# Patient Record
Sex: Female | Born: 1960 | Smoking: Current every day smoker
Health system: Southern US, Community
[De-identification: ages and names within clinical notes are randomized; demographics above are authoritative.]

## PROBLEM LIST (undated history)

## (undated) DIAGNOSIS — G4730 Sleep apnea, unspecified: Secondary | ICD-10-CM

## (undated) DIAGNOSIS — E785 Hyperlipidemia, unspecified: Secondary | ICD-10-CM

## (undated) HISTORY — PX: APPENDECTOMY: SHX54

## (undated) HISTORY — DX: Sleep apnea, unspecified: G47.30

## (undated) HISTORY — DX: Hyperlipidemia, unspecified: E78.5

---

## 1985-11-16 HISTORY — PX: OTHER SURGICAL HISTORY: SHX169

## 2016-08-06 ENCOUNTER — Ambulatory Visit: Payer: Self-pay | Admitting: Physical Medicine & Rehabilitation

## 2016-08-18 ENCOUNTER — Encounter: Payer: Medicaid Other | Attending: Physical Medicine & Rehabilitation

## 2016-08-18 ENCOUNTER — Encounter: Payer: Self-pay | Admitting: Physical Medicine & Rehabilitation

## 2016-08-18 ENCOUNTER — Ambulatory Visit (HOSPITAL_BASED_OUTPATIENT_CLINIC_OR_DEPARTMENT_OTHER): Payer: Medicaid Other | Admitting: Physical Medicine & Rehabilitation

## 2016-08-18 VITALS — BP 124/86 | HR 72 | Resp 16 | Ht 64.0 in | Wt 245.0 lb

## 2016-08-18 DIAGNOSIS — Z79899 Other long term (current) drug therapy: Secondary | ICD-10-CM | POA: Insufficient documentation

## 2016-08-18 DIAGNOSIS — G4730 Sleep apnea, unspecified: Secondary | ICD-10-CM | POA: Diagnosis not present

## 2016-08-18 DIAGNOSIS — G894 Chronic pain syndrome: Secondary | ICD-10-CM | POA: Insufficient documentation

## 2016-08-18 DIAGNOSIS — E785 Hyperlipidemia, unspecified: Secondary | ICD-10-CM | POA: Insufficient documentation

## 2016-08-18 DIAGNOSIS — M545 Low back pain: Secondary | ICD-10-CM | POA: Insufficient documentation

## 2016-08-18 DIAGNOSIS — Z5181 Encounter for therapeutic drug level monitoring: Secondary | ICD-10-CM

## 2016-08-18 DIAGNOSIS — M25562 Pain in left knee: Secondary | ICD-10-CM | POA: Insufficient documentation

## 2016-08-18 DIAGNOSIS — Z9851 Tubal ligation status: Secondary | ICD-10-CM | POA: Diagnosis not present

## 2016-08-18 DIAGNOSIS — M25561 Pain in right knee: Secondary | ICD-10-CM

## 2016-08-18 DIAGNOSIS — F17210 Nicotine dependence, cigarettes, uncomplicated: Secondary | ICD-10-CM | POA: Diagnosis not present

## 2016-08-18 DIAGNOSIS — G8929 Other chronic pain: Secondary | ICD-10-CM

## 2016-08-18 NOTE — Patient Instructions (Signed)
Will send you for x-rays of the low back as well as knees. We will need these done before we do anything else such as prescribed narcotics or do repeat radio frequency procedure.

## 2016-08-18 NOTE — Progress Notes (Signed)
Subjective:    Patient ID: Melinda Young, female    DOB: 07/19/1961, 55 y.o.   MRN: [id number] Chief complaint low back pain, secondary complaint left posterior thigh pain HPI Back pain has been going on for years, thigh pain, buttock pain has been going on for around 4 months. Bilateral knee pain for a number of years. No history of knee or back surgery. Patient states that her left leg goes to sleep when she holds it up. She does not recall her last x-rays. She does not recall MRI of the spine. She has had response to radiofrequency neurotomy 6 month duration. She states she did not reduce her pain medication afterwards, but she did increase her activity level. The patient states he takes 6x10 mg hydrocodone per day, 1 tablet every 4 hours as needed. She states she's tried other medications, but feels this works best for her.  Pain Inventory Average Pain 3 Pain Right Now 4 My pain is sharp  In the last 24 hours, has pain interfered with the following? General activity 6 Relation with others 3 Enjoyment of life 4 What TIME of day is your pain at its worst? night Sleep (in general) Fair  Pain is worse with: walking Pain improves with: medication Relief from Meds: 0  Mobility walk without assistance how many minutes can you walk? 10-15 ability to climb steps?  no do you drive?  no transfers alone Do you have any goals in this area?  no  Function not employed: date last employed 2007 I need assistance with the following:  household duties and shopping Do you have any goals in this area?  no  Neuro/Psych weakness  Prior Studies Any changes since last visit?  no  Physicians involved in your care Any changes since last visit?  no   History reviewed. No pertinent family history. Social History   Social History  . Marital status: Unknown    Spouse name: N/A  . Number of children: N/A  . Years of education: N/A   Social History Main Topics  . Smoking status:  Current Every Day Smoker    Packs/day: 0.50    Years: 23.00  . Smokeless tobacco: Never Used  . Alcohol use 3.6 oz/week    6 Cans of beer per week     Comment: occasionally  . Drug use: No  . Sexual activity: No   Other Topics Concern  . None   Social History Narrative  . None   Past Surgical History:  Procedure Laterality Date  . APPENDECTOMY     1972  . tubial ligation N/A 1987   Past Medical History:  Diagnosis Date  . Hyperlipidemia   . Sleep apnea    BP 124/86   Pulse 72   Resp 16   Ht 5\' 4"  (1.626 m)   Wt 245 lb (111.1 kg)   SpO2 95%   BMI 42.05 kg/m   Opioid Risk Score:   Fall Risk Score:  `1  Depression screen PHQ 2/9  [facility] 08/18/2016 08/18/2016  Decreased Interest 0 0  Down, Depressed, Hopeless 0 0  PHQ - 2 Score 0 0  Altered sleeping 1 -  Tired, decreased energy 1 -  Change in appetite 2 -  Feeling bad or failure about yourself  0 -  Trouble concentrating 2 -  Moving slowly or fidgety/restless 0 -  Suicidal thoughts 0 -  PHQ-9 Score 6 -    Review of Systems  Respiratory: Positive  for apnea.   Gastrointestinal:       Bowl problems  Neurological: Positive for weakness.  All other systems reviewed and are negative.      Objective:   Physical Exam  Constitutional: She is oriented to person, place, and time. She appears well-developed and well-nourished.  HENT:  Head: Normocephalic and atraumatic.  Eyes: Conjunctivae and EOM are normal. Pupils are equal, round, and reactive to light.  Neck: Normal range of motion.  Cardiovascular: Normal rate, regular rhythm and normal heart sounds.   No murmur heard. Pulmonary/Chest: Effort normal and breath sounds normal. No respiratory distress. She has no wheezes.  Abdominal: Soft. Bowel sounds are normal. She exhibits no distension. There is no tenderness.  Musculoskeletal:       Right hip: Normal.       Left hip: Normal.       Right knee: She exhibits no LCL laxity, normal  patellar mobility and no MCL laxity. Tenderness found. Medial joint line and lateral joint line tenderness noted. No patellar tendon tenderness noted.       Left knee: She exhibits normal range of motion, no effusion, no LCL laxity, normal patellar mobility and no MCL laxity. Tenderness found. Medial joint line and lateral joint line tenderness noted. No patellar tendon tenderness noted.       Cervical back: Normal.       Thoracic back: She exhibits decreased range of motion. She exhibits no tenderness and no deformity.       Lumbar back: She exhibits decreased range of motion and tenderness. She exhibits no deformity.  Neurological: She is alert and oriented to person, place, and time. Coordination normal.  Psychiatric: She has a normal mood and affect.  Nursing note and vitals reviewed.   Overweight female in acute distress      Assessment & Plan:  1. Chronic low back pain. Given her response to lumbar radiofrequency neurotomy, suspect facet arthropathy. We'll check x-rays, lumbar spine. Given no evidence of radiculopathy would not order MRI at the current time. We discussed that she will need some documentation of any type of abnormalities before we can assume treatment.  2. Chronic bilateral knee pain. She does have knee tenderness, but no evidence of effusion. Have ordered knee x-rays to further evaluate. If there is signs of osteoarthritis may benefit from corticosteroid injection. She did state she had these before.  We discussed that she will need urine drug screen as well as documentation of abnormalities in the painful areas described. We will look for a consistent urine drug screen with the medications that she is currently taking.  We also discussed the need for monthly visits if we do prescribe narcotic analgesics that are schedule II

## 2016-08-25 LAB — TOXASSURE SELECT,+ANTIDEPR,UR

## 2016-08-26 ENCOUNTER — Telehealth: Payer: Self-pay | Admitting: *Deleted

## 2016-08-26 NOTE — Progress Notes (Signed)
Urine drug screen for this encounter is consistent for prescribed medication 

## 2016-08-26 NOTE — Telephone Encounter (Signed)
Patient's UDS came back consistent. This was a New visit.  If narcotics are prescribed, we will need a CSA signed

## 2016-08-27 NOTE — Telephone Encounter (Signed)
noted 

## 2016-08-27 NOTE — Telephone Encounter (Signed)
Patient needs to undergo both lumbar and knee x-rays. These have been ordered. This is to document objective evidence of her complaints. We'll not prescribe any narcotic analgesics until these are done

## 2016-08-28 ENCOUNTER — Ambulatory Visit (HOSPITAL_COMMUNITY)
Admission: RE | Admit: 2016-08-28 | Discharge: 2016-08-28 | Disposition: A | Discharge status: Home / Self Care | Payer: Medicaid Other | Source: Ambulatory Visit | Attending: Physical Medicine & Rehabilitation | Admitting: Physical Medicine & Rehabilitation

## 2016-08-28 DIAGNOSIS — M5136 Other intervertebral disc degeneration, lumbar region: Secondary | ICD-10-CM | POA: Insufficient documentation

## 2016-08-28 DIAGNOSIS — R937 Abnormal findings on diagnostic imaging of other parts of musculoskeletal system: Secondary | ICD-10-CM | POA: Diagnosis not present

## 2016-08-28 DIAGNOSIS — G8929 Other chronic pain: Secondary | ICD-10-CM

## 2016-08-28 DIAGNOSIS — M545 Low back pain, unspecified: Secondary | ICD-10-CM

## 2016-08-28 DIAGNOSIS — M25562 Pain in left knee: Secondary | ICD-10-CM | POA: Insufficient documentation

## 2016-08-28 DIAGNOSIS — M4186 Other forms of scoliosis, lumbar region: Secondary | ICD-10-CM | POA: Diagnosis not present

## 2016-08-28 DIAGNOSIS — M25561 Pain in right knee: Secondary | ICD-10-CM | POA: Insufficient documentation

## 2016-09-04 ENCOUNTER — Ambulatory Visit (HOSPITAL_BASED_OUTPATIENT_CLINIC_OR_DEPARTMENT_OTHER): Payer: Medicaid Other | Admitting: Physical Medicine & Rehabilitation

## 2016-09-04 ENCOUNTER — Encounter: Payer: Self-pay | Admitting: Physical Medicine & Rehabilitation

## 2016-09-04 VITALS — BP 140/87 | HR 74 | Resp 14

## 2016-09-04 DIAGNOSIS — G8929 Other chronic pain: Secondary | ICD-10-CM | POA: Diagnosis not present

## 2016-09-04 DIAGNOSIS — G894 Chronic pain syndrome: Secondary | ICD-10-CM | POA: Diagnosis not present

## 2016-09-04 DIAGNOSIS — M25561 Pain in right knee: Secondary | ICD-10-CM | POA: Diagnosis not present

## 2016-09-04 DIAGNOSIS — M545 Low back pain: Secondary | ICD-10-CM

## 2016-09-04 DIAGNOSIS — M25562 Pain in left knee: Secondary | ICD-10-CM

## 2016-09-04 MED ORDER — HYDROCODONE-ACETAMINOPHEN 7.5-325 MG PO TABS: 1.0000 | ORAL_TABLET | ORAL | 0 refills | Status: DC | PRN

## 2016-09-04 NOTE — Progress Notes (Signed)
Subjective:    Patient ID: Melinda Young, female    DOB: 12/24/1960, 55 y.o.   MRN: [id number]  HPI 55 year old female with chronic low back and chronic knee pain. She has had no knee swelling. No pain shooting down from the back toward the knee. She does have some right buttock pain. Her pain is mainly with standing. She is independent with all self-care and ADLs. She does help raise children Her pain is listed as moderate  Patient had x-rays of bilateral knees as well as lumbar spine formed on 08/28/2016, is here to review findings.  No falls, no significant interval medical history since initial evaluation on 08/18/2016  Pain Inventory Average Pain 4 Pain Right Now 3 My pain is intermittent and stabbing  In the last 24 hours, has pain interfered with the following? General activity no selection Relation with others no selection Enjoyment of life no selection What TIME of day is your pain at its worst? varies Sleep (in general) Fair  Pain is worse with: walking, bending, sitting, standing and some activites Pain improves with: medication Relief from Meds: 7  Mobility walk without assistance  Function not employed: date last employed .  Neuro/Psych bladder control problems spasms  Prior Studies Any changes since last visit?  yes x-rays  Physicians involved in your care Any changes since last visit?  no   History reviewed. No pertinent family history. Social History   Social History  . Marital status: Unknown    Spouse name: N/A  . Number of children: N/A  . Years of education: N/A   Social History Main Topics  . Smoking status: Current Every Day Smoker    Packs/day: 0.50    Years: 23.00  . Smokeless tobacco: Never Used  . Alcohol use 3.6 oz/week    6 Cans of beer per week     Comment: occasionally  . Drug use: No  . Sexual activity: No   Other Topics Concern  . None   Social History Narrative  . None   Past Surgical History:  Procedure  Laterality Date  . APPENDECTOMY     1972  . tubial ligation N/A 1987   Past Medical History:  Diagnosis Date  . Hyperlipidemia   . Sleep apnea    BP 140/87 (BP Location: Left Arm, Patient Position: Sitting, Cuff Size: Large)   Pulse 74   Resp 14   SpO2 95%   Opioid Risk Score:   Fall Risk Score:  `1  Depression screen PHQ 2/9  [facility] 08/18/2016 08/18/2016  Decreased Interest 0 0  Down, Depressed, Hopeless 0 0  PHQ - 2 Score 0 0  Altered sleeping 1 -  Tired, decreased energy 1 -  Change in appetite 2 -  Feeling bad or failure about yourself  0 -  Trouble concentrating 2 -  Moving slowly or fidgety/restless 0 -  Suicidal thoughts 0 -  PHQ-9 Score 6 -   ] Review of Systems  Constitutional: Negative.   HENT: Negative.   Eyes: Negative.   Cardiovascular: Negative.   Endocrine: Negative.   Genitourinary: Negative.   Musculoskeletal: Positive for arthralgias and back pain.  Skin: Negative.   Neurological: Negative.   Hematological: Negative.   Psychiatric/Behavioral: Negative.        Objective:   Physical Exam  Constitutional: She is oriented to person, place, and time. She appears well-developed and well-nourished.  HENT:  Head: Normocephalic and atraumatic.  Eyes: Conjunctivae are normal. Pupils are  equal, round, and reactive to light.  Neck: Normal range of motion.  Neurological: She is alert and oriented to person, place, and time. She displays no atrophy and no tremor. Gait normal.  Negative straight leg raise  Psychiatric: Her speech is normal and behavior is normal. Judgment and thought content normal. Her affect is blunt. Cognition and memory are normal.  Nursing note and vitals reviewed. . Lumbar spine range of motion, reduced approximately 50% range flexion, extension, lateral bending and rotation  Knee range of motion is normal with flexion and extension. She has mild medial and lateral joint line tenderness bilaterally. No evidence  of any effusion. No clicks or grinding noted.  Normal strength bilateral lower extremities      Assessment & Plan:  1. Chronic low back pain. No evidence of radiculopathy or sciatic symptoms. She reports prior radiofrequency neurotomy as being helpful for her pain.  Lumbar x-rays show mild L5-4-5, L5-S1 facet arthropathy. We discussed medial branch blocks. If she does have a driver for next visit.  2. Chronic knee pain. X-rays show no evidence of any significant arthritis, exam is unremarkable, do not see any joint effusion or erythema. At this point, I do not see any mechanical symptoms to indicate any type of significant meniscal tear. She has potential loose body, but this appears to be outside the joint space. We discussed potential orthopedic referral. However, she does not wish to pursue this at the current time. Mild patellofemoral space narrowing noted  3. Chronic pain syndrome. She has been on moderate to high-dose narcotic analgesics for a prolonged period of time. We discussed that she does not have any significant pathology on her imaging studies to objectify her complaints.We reviewed her x-rays together We discussed a wean of her hydrocodone, followed by starting her on either tramadol or Tylenol with codeine. Patient agrees with this. Will reduce Norco to 7.5mg  1 po Q4h prn x 2wks then  MD visit for MBB vs Knee injection in wks then  Norco 5mg  Q 4h x 2 wks Then change to either tramadol or Tylenol with codeine

## 2016-09-04 NOTE — Patient Instructions (Addendum)
Aspercreme to knee twice a day  Gradually weaning hydrocodone Reduce Norco to 7.5 milligrams, every 4 hours as needed for 2 weeks, then will reduce to 5 mg every 4 hours as needed

## 2016-09-18 ENCOUNTER — Encounter: Payer: Medicaid Other | Attending: Physical Medicine & Rehabilitation

## 2016-09-18 ENCOUNTER — Ambulatory Visit (HOSPITAL_BASED_OUTPATIENT_CLINIC_OR_DEPARTMENT_OTHER): Payer: Medicaid Other | Admitting: Physical Medicine & Rehabilitation

## 2016-09-18 ENCOUNTER — Encounter: Payer: Self-pay | Admitting: Physical Medicine & Rehabilitation

## 2016-09-18 VITALS — BP 120/85 | HR 77 | Resp 14

## 2016-09-18 DIAGNOSIS — F17210 Nicotine dependence, cigarettes, uncomplicated: Secondary | ICD-10-CM | POA: Insufficient documentation

## 2016-09-18 DIAGNOSIS — G8929 Other chronic pain: Secondary | ICD-10-CM

## 2016-09-18 DIAGNOSIS — Z79899 Other long term (current) drug therapy: Secondary | ICD-10-CM | POA: Insufficient documentation

## 2016-09-18 DIAGNOSIS — G894 Chronic pain syndrome: Secondary | ICD-10-CM | POA: Insufficient documentation

## 2016-09-18 DIAGNOSIS — E785 Hyperlipidemia, unspecified: Secondary | ICD-10-CM | POA: Insufficient documentation

## 2016-09-18 DIAGNOSIS — Z5181 Encounter for therapeutic drug level monitoring: Secondary | ICD-10-CM | POA: Insufficient documentation

## 2016-09-18 DIAGNOSIS — M25562 Pain in left knee: Secondary | ICD-10-CM | POA: Diagnosis not present

## 2016-09-18 DIAGNOSIS — M545 Low back pain, unspecified: Secondary | ICD-10-CM

## 2016-09-18 DIAGNOSIS — Z9851 Tubal ligation status: Secondary | ICD-10-CM | POA: Diagnosis not present

## 2016-09-18 DIAGNOSIS — M25561 Pain in right knee: Secondary | ICD-10-CM | POA: Insufficient documentation

## 2016-09-18 DIAGNOSIS — G4730 Sleep apnea, unspecified: Secondary | ICD-10-CM | POA: Diagnosis not present

## 2016-09-18 MED ORDER — HYDROCODONE-ACETAMINOPHEN 7.5-325 MG PO TABS: 1.0000 | ORAL_TABLET | Freq: Four times a day (QID) | ORAL | 0 refills | Status: DC | PRN

## 2016-09-18 MED ORDER — MELOXICAM 7.5 MG PO TABS: 7.5000 mg | ORAL_TABLET | Freq: Every day | ORAL | 1 refills | Status: AC

## 2016-09-18 NOTE — Patient Instructions (Signed)

## 2016-09-18 NOTE — Progress Notes (Addendum)
Subjective:    Patient ID: Mack GuiseDonna Jimmerson, female    DOB: 12/27/1960, 55 y.o.   MRN: [id number]  HPI Patient was scheduled for lumbar spine injection today but does not have a driver. Continues to have low back pain.  Pain Inventory Average Pain 4 Pain Right Now 5 My pain is stabbing and aching  In the last 24 hours, has pain interfered with the following? General activity 7 Relation with others 4 Enjoyment of life 6 What TIME of day is your pain at its worst? daytime Sleep (in general) Poor  Pain is worse with: walking and standing Pain improves with: medication Relief from Meds: 7  Mobility walk without assistance how many minutes can you walk? 15 do you drive?  yes  Function not employed: date last employed .  Neuro/Psych spasms  Prior Studies Any changes since last visit?  no  Physicians involved in your care Any changes since last visit?  no   History reviewed. No pertinent family history. Social History   Social History  . Marital status: Unknown    Spouse name: N/A  . Number of children: N/A  . Years of education: N/A   Social History Main Topics  . Smoking status: Current Every Day Smoker    Packs/day: 0.50    Years: 23.00  . Smokeless tobacco: Never Used  . Alcohol use 3.6 oz/week    6 Cans of beer per week     Comment: occasionally  . Drug use: No  . Sexual activity: No   Other Topics Concern  . None   Social History Narrative  . None   Past Surgical History:  Procedure Laterality Date  . APPENDECTOMY     1972  . tubial ligation N/A 1987   Past Medical History:  Diagnosis Date  . Hyperlipidemia   . Sleep apnea    BP 120/85 (BP Location: Left Arm, Patient Position: Sitting, Cuff Size: Large)   Pulse 77   Resp 14   SpO2 94%   Opioid Risk Score:   Fall Risk Score:  `1  Depression screen PHQ 2/9  [facility] 08/18/2016 08/18/2016  Decreased Interest 0 0  Down, Depressed, Hopeless 0 0  PHQ - 2 Score 0 0    Altered sleeping 1 -  Tired, decreased energy 1 -  Change in appetite 2 -  Feeling bad or failure about yourself  0 -  Trouble concentrating 2 -  Moving slowly or fidgety/restless 0 -  Suicidal thoughts 0 -  PHQ-9 Score 6 -    Review of Systems  Constitutional: Negative.   HENT: Negative.   Eyes: Negative.   Respiratory: Negative.   Gastrointestinal: Negative.   Endocrine: Negative.   Genitourinary: Negative.   Musculoskeletal: Positive for arthralgias, back pain and myalgias.       Spasms   Skin: Negative.   Allergic/Immunologic: Negative.   Neurological: Negative.   Hematological: Negative.   Psychiatric/Behavioral: Negative.   All other systems reviewed and are negative.      Objective:   Physical Exam  Constitutional: She is oriented to person, place, and time. She appears well-developed and well-nourished.  HENT:  Head: Normocephalic and atraumatic.  Eyes: Conjunctivae and EOM are normal. Pupils are equal, round, and reactive to light.  Neurological: She is alert and oriented to person, place, and time.  Psychiatric: She has a normal mood and affect.  Nursing note and vitals reviewed.   Tenderness palpation. Lumbar spine negative straight leg raising  Assessment & Plan:  1. Chronic low back pain, lumbar facet arthropathy. Probable pain generator Recommended medial branch blocks Back exercises given to patient 2. Chronic bilateral knee pain No significant DJD noted on x-rays performed 08/28/2016. Loose body on the right side only, but no symptoms of intermittent catching, clicking. May have some meniscal degeneration.

## 2016-09-22 ENCOUNTER — Telehealth: Payer: Self-pay | Admitting: Physical Medicine & Rehabilitation

## 2016-09-22 NOTE — Telephone Encounter (Signed)
Patient needs prior authorization on hydrocodone, states her pharmacy sent to our office.  Please call patient.

## 2016-09-23 NOTE — Telephone Encounter (Signed)
Prior authorization submitted [facility]aminophen 7.5-[facility]

## 2016-09-23 NOTE — Telephone Encounter (Signed)
Contacted pt and informed that PA has been submitted

## 2016-10-19 ENCOUNTER — Ambulatory Visit: Payer: Medicaid Other | Admitting: Registered Nurse

## 2016-10-22 ENCOUNTER — Ambulatory Visit (HOSPITAL_BASED_OUTPATIENT_CLINIC_OR_DEPARTMENT_OTHER): Payer: Medicaid Other | Admitting: Physical Medicine & Rehabilitation

## 2016-10-22 ENCOUNTER — Encounter: Payer: Medicaid Other | Attending: Physical Medicine & Rehabilitation

## 2016-10-22 ENCOUNTER — Encounter: Payer: Self-pay | Admitting: Physical Medicine & Rehabilitation

## 2016-10-22 VITALS — BP 125/81 | HR 73

## 2016-10-22 DIAGNOSIS — Z5181 Encounter for therapeutic drug level monitoring: Secondary | ICD-10-CM | POA: Insufficient documentation

## 2016-10-22 DIAGNOSIS — F17210 Nicotine dependence, cigarettes, uncomplicated: Secondary | ICD-10-CM | POA: Diagnosis not present

## 2016-10-22 DIAGNOSIS — M25561 Pain in right knee: Secondary | ICD-10-CM | POA: Diagnosis not present

## 2016-10-22 DIAGNOSIS — G4730 Sleep apnea, unspecified: Secondary | ICD-10-CM | POA: Insufficient documentation

## 2016-10-22 DIAGNOSIS — G894 Chronic pain syndrome: Secondary | ICD-10-CM | POA: Insufficient documentation

## 2016-10-22 DIAGNOSIS — E785 Hyperlipidemia, unspecified: Secondary | ICD-10-CM | POA: Diagnosis not present

## 2016-10-22 DIAGNOSIS — M25562 Pain in left knee: Secondary | ICD-10-CM | POA: Diagnosis not present

## 2016-10-22 DIAGNOSIS — Z9851 Tubal ligation status: Secondary | ICD-10-CM | POA: Diagnosis not present

## 2016-10-22 DIAGNOSIS — M545 Low back pain: Secondary | ICD-10-CM | POA: Insufficient documentation

## 2016-10-22 DIAGNOSIS — M47816 Spondylosis without myelopathy or radiculopathy, lumbar region: Secondary | ICD-10-CM | POA: Diagnosis not present

## 2016-10-22 DIAGNOSIS — Z79899 Other long term (current) drug therapy: Secondary | ICD-10-CM | POA: Diagnosis not present

## 2016-10-22 MED ORDER — HYDROCODONE-ACETAMINOPHEN 7.5-325 MG PO TABS: 1.0000 | ORAL_TABLET | Freq: Four times a day (QID) | ORAL | 0 refills | Status: AC | PRN

## 2016-10-22 NOTE — Progress Notes (Addendum)
  PROCEDURE RECORD [facility]   Name: Melinda Young DOB:01/18/1961 MRN: [id number]  Date:10/22/2016  Physician: Claudette LawsAndrew Kirsteins, MD    Nurse/CMA: Arlina Sabina, CMA  Allergies: No Known Allergies  Consent Signed: Yes.    Is patient diabetic? No.  CBG today? n/a  Pregnant: No. LMP: No LMP recorded. Patient is postmenopausal. (age 55-55)  Anticoagulants: no Anti-inflammatory: no Antibiotics: no  Procedure: bilateral medial branch block  Position: Prone Start Time: 11:33am  End Time: 11:39am  Fluoro Time: 35  RN/CMA Shelanda Duvall, CMA Hamsini Verrilli, CMA    Time 10:55am 11:45am    BP 125/81 131/85    Pulse 73 66    Respirations 14 14    O2 Sat 93 96    S/S 6 6    Pain Level 5/10 4/10     D/C home with SCAT, patient A & O X 3, D/C instructions reviewed, and sits independently.

## 2016-10-22 NOTE — Patient Instructions (Signed)

## 2016-10-22 NOTE — Progress Notes (Signed)
Bilateral Lumbar L3, L4  medial branch blocks and L 5 dorsal ramus injection under fluoroscopic guidance  Indication: Lumbar pain which is not relieved by medication management or other conservative care and interfering with self-care and mobility.  Informed consent was obtained after describing risks and benefits of the procedure with the patient, this includes bleeding, infection, paralysis and medication side effects.  The patient wishes to proceed and has given written consent.  The patient was placed in prone position.  The lumbar area was marked and prepped with Betadine.  One mL of 1% lidocaine was injected into each of 6 areas into the skin and subcutaneous tissue.  Then a 22-gauge 3.5 in spinal needle was inserted targeting the junction of the left S1 superior articular process and sacral ala junction. Needle was advanced under fluoroscopic guidance.  Bone contact was made.  Omnipaque 180 was injected x 0.5 mL demonstrating no intravascular uptake.  Then a solution containing one mL of 4 mg per mL dexamethasone and 3 mL of 2% MPF lidocaine was injected x 0.5 mL.  Then the left L5 superior articular process in transverse process junction was targeted.  Bone contact was made.  Omnipaque 180 was injected x 0.5 mL demonstrating no intravascular uptake. Then a solution containing one mL of 4 mg per mL dexamethasone and 3 mL of 2% MPF lidocaine was injected x 0.5 mL.  Then the left L4 superior articular process in transverse process junction was targeted.  Bone contact was made.  Omnipaque 180 was injected x 0.5 mL demonstrating no intravascular uptake.  Then a solution containing one mL of 4 mg per mL dexamethasone and 3 mL if 2% MPF lidocaine was injected x 0.5 mL.  This same procedure was performed on the right side using the same needle, technique and injectate.  Patient tolerated procedure well.  Post procedure instructions were given. 

## 2016-10-30 ENCOUNTER — Other Ambulatory Visit (HOSPITAL_COMMUNITY): Payer: Self-pay | Admitting: Emergency Medicine

## 2016-10-30 DIAGNOSIS — Z1231 Encounter for screening mammogram for malignant neoplasm of breast: Secondary | ICD-10-CM

## 2016-11-02 ENCOUNTER — Telehealth: Payer: Self-pay | Admitting: Physical Medicine & Rehabilitation

## 2016-11-02 NOTE — Telephone Encounter (Signed)
Patient wants to be discharged from our practice and needs a letter.

## 2016-11-03 NOTE — Telephone Encounter (Signed)
Letter written and faxed to Dr Cathey [place]en in [place]  ([phone])at her request discharging her from our practice.  She may return for injections only in future.

## 2016-11-18 ENCOUNTER — Ambulatory Visit (HOSPITAL_COMMUNITY): Payer: Medicaid Other

## 2016-11-19 ENCOUNTER — Ambulatory Visit: Payer: Medicaid Other | Admitting: Registered Nurse

## 2017-01-01 ENCOUNTER — Ambulatory Visit (HOSPITAL_COMMUNITY): Payer: Medicaid Other

## 2017-01-11 ENCOUNTER — Ambulatory Visit (HOSPITAL_COMMUNITY): Payer: Medicaid Other

## 2018-07-02 IMAGING — DX DG LUMBAR SPINE 2-3V
3 series · 3 of 3 positions shown · non-contrast
Comparison: None.

CLINICAL DATA: Chronic low back pain, bilateral.  No sciatica.

EXAM:
LUMBAR SPINE - 2-3 VIEW

[l-spine ap]
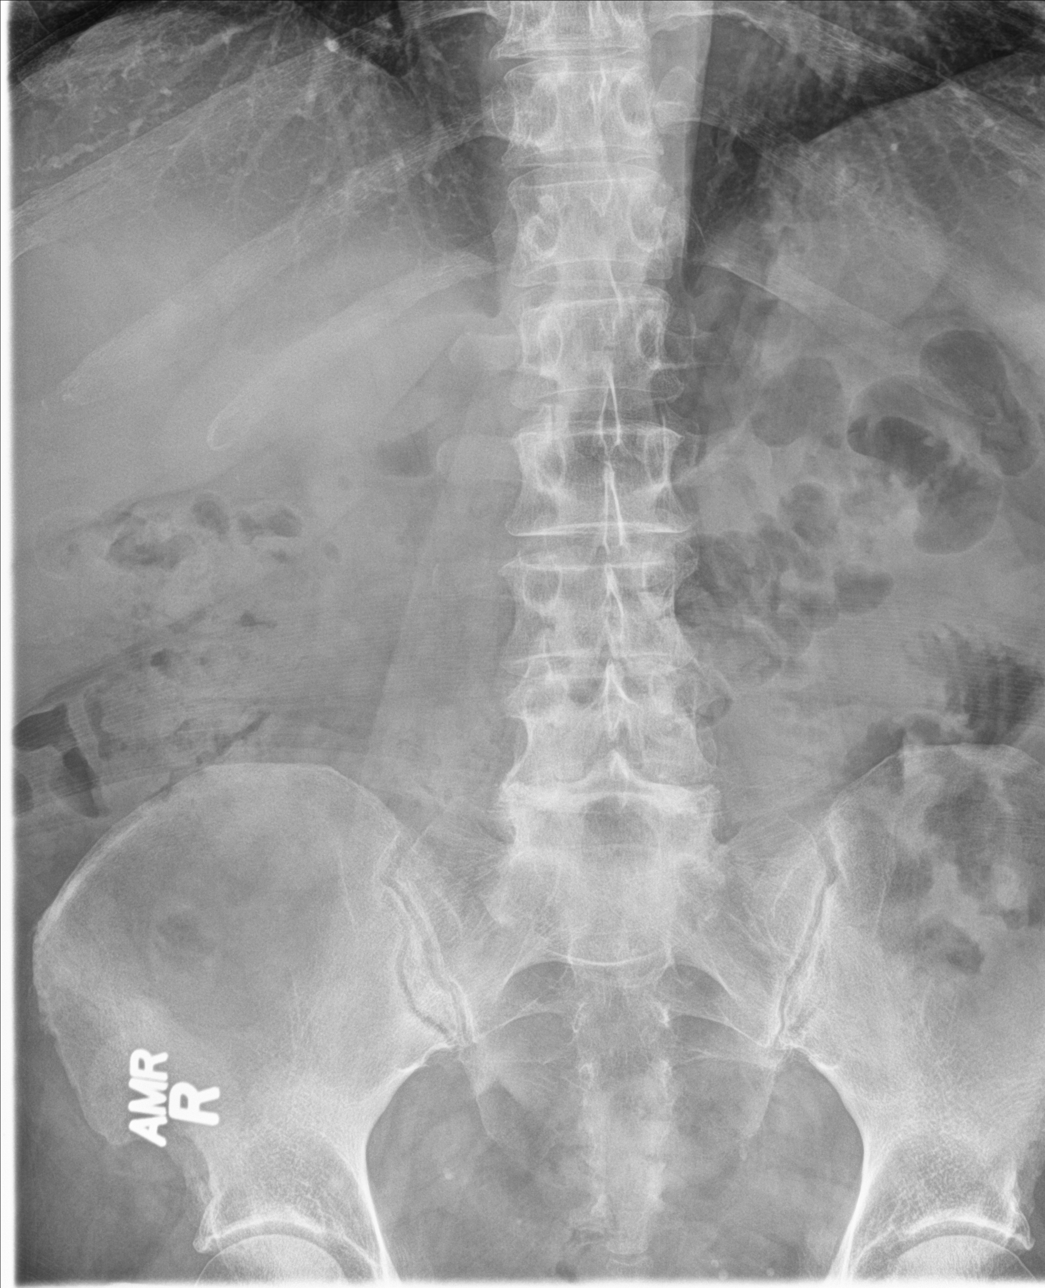

[l-spine lat]
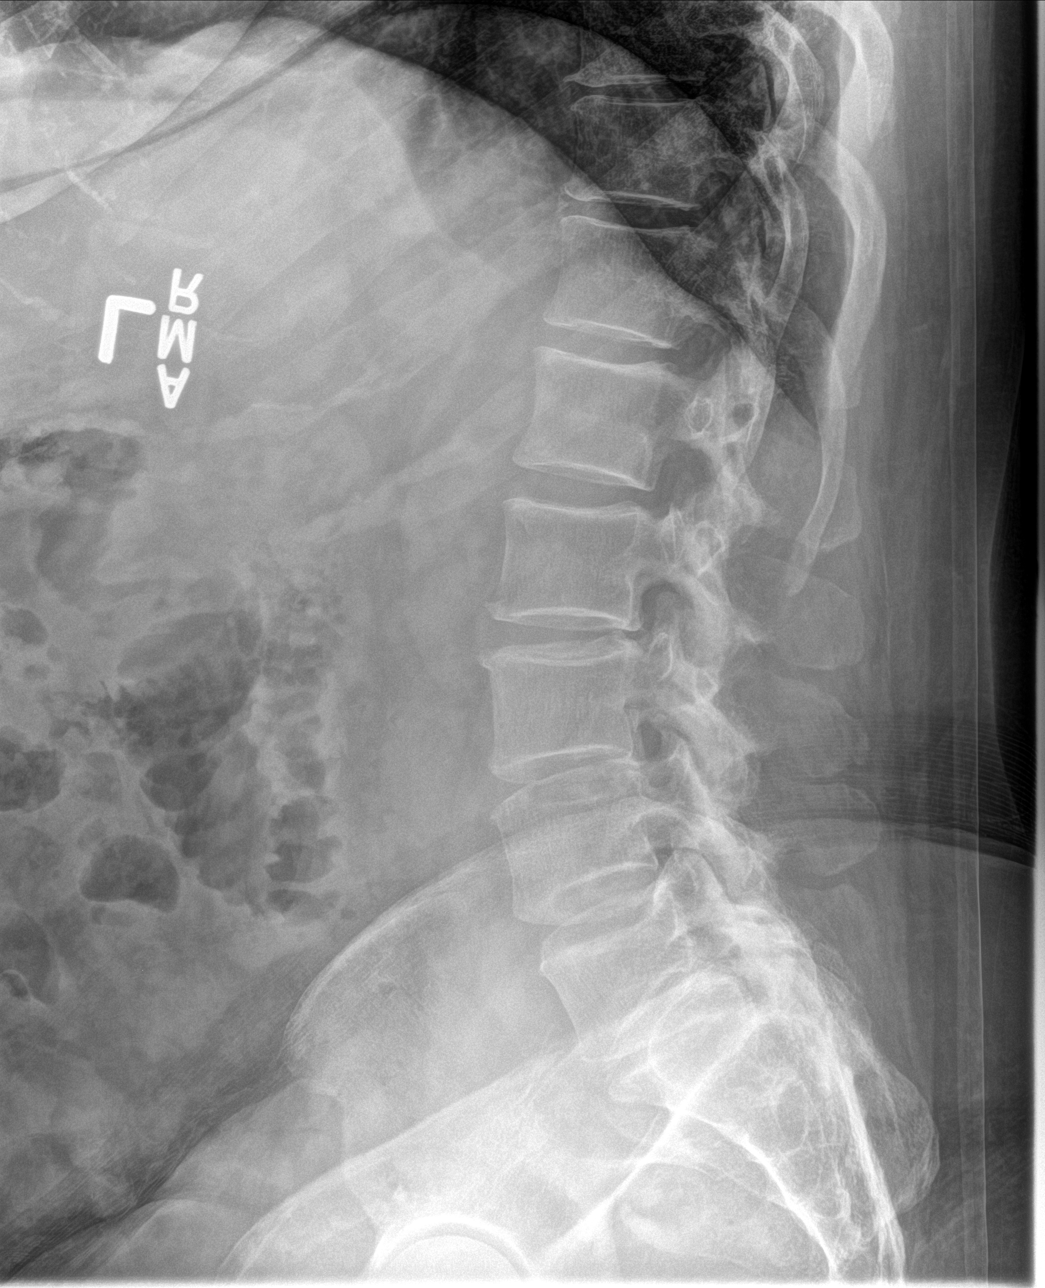

[l-spine spot]
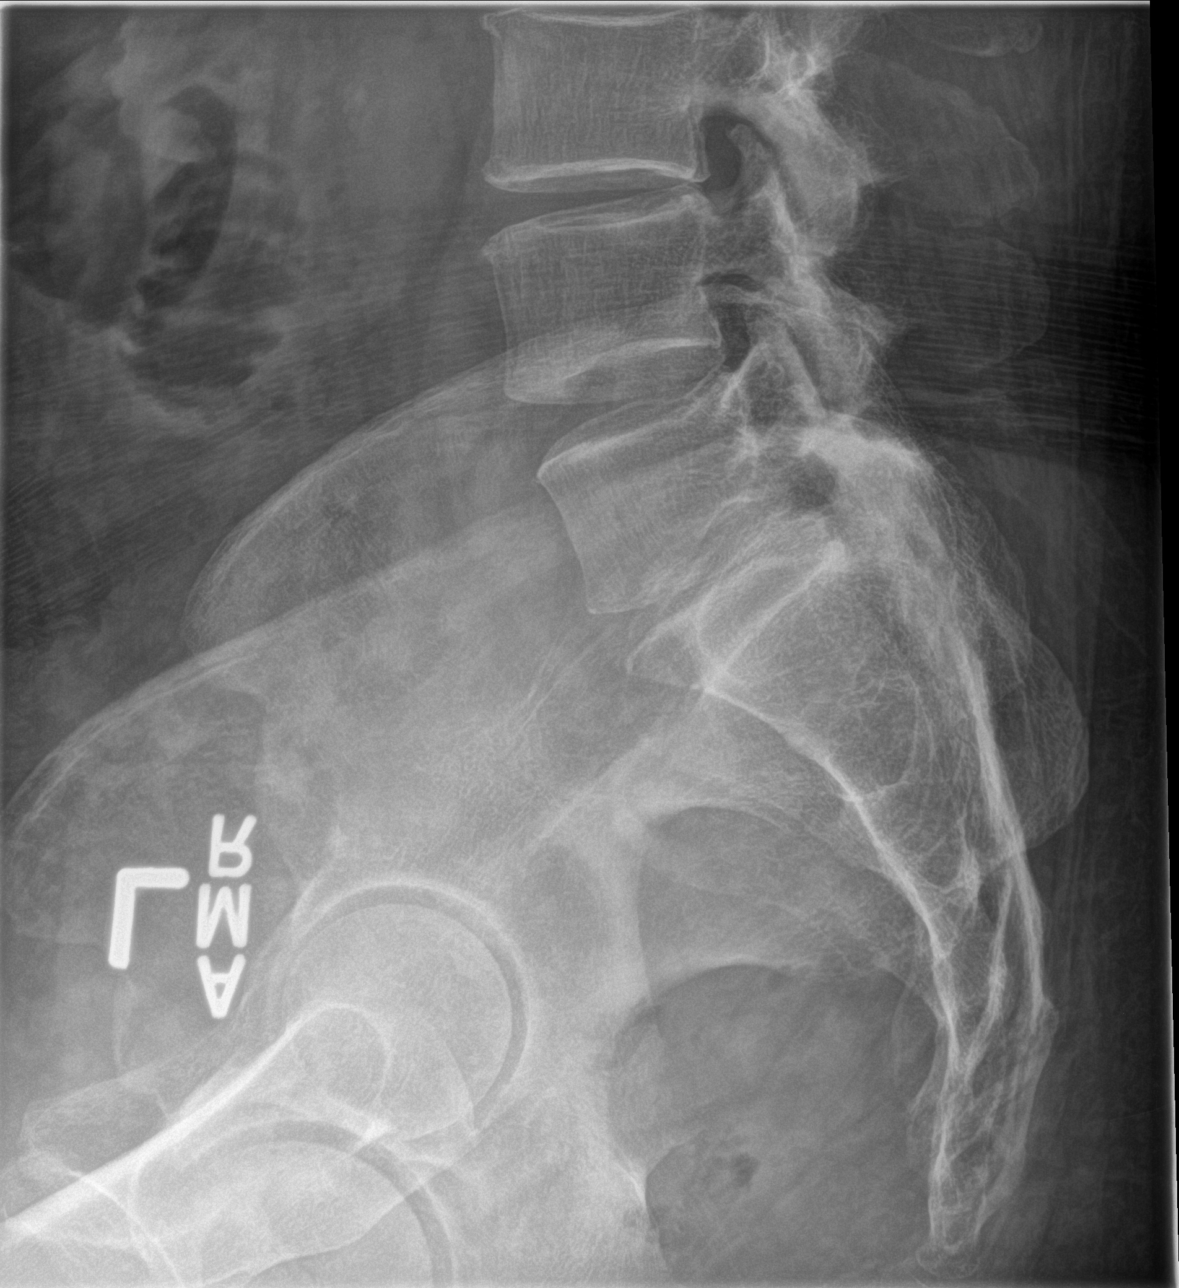

[3 of 3 positions shown; findings below may reference images not displayed]

FINDINGS: There is a very mild dextroscoliosis of the lumbar spine, centered
at the L3 vertebral body level. Minimal disc desiccations noted at
the L2-3 through L5-S1 levels, with slight disc space narrowings and
minimal osseous spurring. No evidence of advanced degenerative
change at any level. Additional mild degenerative facet arthropathy
noted at the L4 through S1 levels.

Overall bone mineralization is normal. No acute or suspicious
osseous finding. No fracture line or displaced fracture fragment.
Upper sacrum appears intact and normally aligned. Visualized
paravertebral soft tissues are unremarkable.
IMPRESSION: 1. Very mild degenerative change within the lumbar spine, as
described above.
2. Very mild scoliosis.
3. No acute findings.
